# Patient Record
Sex: Male | Born: 1975 | Hispanic: Yes | Marital: Single
Health system: Southern US, Community
[De-identification: ages and names within clinical notes are randomized; demographics above are authoritative.]

## PROBLEM LIST (undated history)

## (undated) DIAGNOSIS — I10 Essential (primary) hypertension: Secondary | ICD-10-CM

## (undated) DIAGNOSIS — K469 Unspecified abdominal hernia without obstruction or gangrene: Secondary | ICD-10-CM

## (undated) DIAGNOSIS — J45909 Unspecified asthma, uncomplicated: Secondary | ICD-10-CM

## (undated) DIAGNOSIS — K219 Gastro-esophageal reflux disease without esophagitis: Secondary | ICD-10-CM

## (undated) HISTORY — DX: Unspecified abdominal hernia without obstruction or gangrene: K46.9

## (undated) HISTORY — DX: Gastro-esophageal reflux disease without esophagitis: K21.9

---

## 2013-04-01 ENCOUNTER — Emergency Department (HOSPITAL_COMMUNITY): Payer: No Typology Code available for payment source

## 2013-04-01 ENCOUNTER — Emergency Department (HOSPITAL_COMMUNITY)
Admission: EM | Admit: 2013-04-01 | Discharge: 2013-04-01 | Disposition: A | Discharge status: Home / Self Care | Payer: No Typology Code available for payment source | Attending: Emergency Medicine | Admitting: Emergency Medicine

## 2013-04-01 ENCOUNTER — Encounter (HOSPITAL_COMMUNITY): Payer: Self-pay | Admitting: Radiology

## 2013-04-01 DIAGNOSIS — S8000XA Contusion of unspecified knee, initial encounter: Secondary | ICD-10-CM | POA: Insufficient documentation

## 2013-04-01 DIAGNOSIS — S8002XA Contusion of left knee, initial encounter: Secondary | ICD-10-CM

## 2013-04-01 DIAGNOSIS — Y92410 Unspecified street and highway as the place of occurrence of the external cause: Secondary | ICD-10-CM | POA: Insufficient documentation

## 2013-04-01 DIAGNOSIS — S301XXA Contusion of abdominal wall, initial encounter: Secondary | ICD-10-CM

## 2013-04-01 DIAGNOSIS — S139XXA Sprain of joints and ligaments of unspecified parts of neck, initial encounter: Secondary | ICD-10-CM | POA: Insufficient documentation

## 2013-04-01 DIAGNOSIS — Y9389 Activity, other specified: Secondary | ICD-10-CM | POA: Insufficient documentation

## 2013-04-01 DIAGNOSIS — S161XXA Strain of muscle, fascia and tendon at neck level, initial encounter: Secondary | ICD-10-CM

## 2013-04-01 MED ORDER — OXYCODONE-ACETAMINOPHEN 5-325 MG PO TABS
2.0000 | ORAL_TABLET | Freq: Once | ORAL | Status: AC
  Administered 2013-04-01: 2 via ORAL
  Filled 2013-04-01: qty 2

## 2013-04-01 MED ORDER — TRAMADOL HCL 50 MG PO TABS: 50.0000 mg | ORAL_TABLET | Freq: Four times a day (QID) | ORAL | Status: AC | PRN

## 2013-04-01 MED ORDER — IOHEXOL 300 MG/ML  SOLN
100.0000 mL | Freq: Once | INTRAMUSCULAR | Status: AC | PRN
  Administered 2013-04-01: 100 mL via INTRAVENOUS

## 2013-04-01 MED ORDER — NAPROXEN 500 MG PO TABS: 500.0000 mg | ORAL_TABLET | Freq: Two times a day (BID) | ORAL | Status: AC
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
## 2013-04-01 NOTE — Discharge Instructions (Signed)
xrays are normal Wear the knee brace for one week Naprosyn twice daily Ultram for severe pain    Acetaminophen; Oxycodone tablets or PERCOCET  This is a pain reliever. It is used to treat mild to moderate pain.  This medicine may be used for other purposes; ask your health care provider or pharmacist if you have questions.  What should I tell my health care provider before I take this medicine?  They need to know if you have any of these conditions:  -brain tumor  -Crohn's disease, inflammatory bowel disease, or ulcerative colitis  -drink more than 3 alcohol containing drinks per day  -drug abuse or addiction  -head injury  -heart or circulation problems  -kidney disease or problems going to the bathroom  -liver disease  -lung disease, asthma, or breathing problems  -an unusual or allergic reaction to acetaminophen, oxycodone, other opioid analgesics, other medicines, foods, dyes, or preservatives  -pregnant or trying to get pregnant  -breast-feeding  How should I use this medicine?  Take this medicine by mouth with a full glass of water. Follow the directions on the prescription label. Take your medicine at regular intervals. Do not take your medicine more often than directed.  Talk to your pediatrician regarding the use of this medicine in children. Special care may be needed.  Patients over 36 years old may have a stronger reaction and need a smaller dose.  Overdosage: If you think you have taken too much of this medicine contact a poison control center or emergency room at once.  NOTE: This medicine is only for you. Do not share this medicine with others.   What may interact with this medicine?  -alcohol or medicines that contain alcohol  -antihistamines  -barbiturates like amobarbital, butalbital, butabarbital, methohexital, pentobarbital, phenobarbital, thiopental, and secobarbital  -benztropine  -drugs for bladder problems like solifenacin, trospium, oxybutynin,  tolterodine, hyoscyamine, and methscopolamine  -drugs for breathing problems like ipratropium and tiotropium  -drugs for certain stomach or intestine problems like propantheline, homatropine methylbromide, glycopyrrolate, atropine, belladonna, and dicyclomine  -general anesthetics like etomidate, ketamine, nitrous oxide, propofol, desflurane, enflurane, halothane, isoflurane, and sevoflurane  -medicines for depression, anxiety, or psychotic disturbances  -medicines for pain like codeine, morphine, pentazocine, buprenorphine, butorphanol, nalbuphine, tramadol, and propoxyphene  -medicines for sleep  -muscle relaxants  -naltrexone  -phenothiazines like perphenazine, thioridazine, chlorpromazine, mesoridazine, fluphenazine, prochlorperazine, promazine, and trifluoperazine  -scopolamine  -trihexyphenidyl  This list may not describe all possible interactions. Give your health care provider a list of all the medicines, herbs, non-prescription drugs, or dietary supplements you use. Also tell them if you smoke, drink alcohol, or use illegal drugs. Some items may interact with your medicine.  What should I watch for while using this medicine?  Tell your doctor or health care professional if your pain does not go away, if it gets worse, or if you have new or a different type of pain Do not suddenly stop taking your medicine because you may develop a severe reaction. Your body becomes used to the medicine. This does NOT mean you are addicted. Addiction is a behavior related to getting and using a drug for a nonmedical reason.  You may get drowsy or dizzy. Do not drive, use machinery, or do anything that needs mental alertness until you know how this medicine affects you. Do not stand or sit up quickly, especially if you are an older patient. This reduces the risk of dizzy or fainting spells. Alcohol may interfere with the effect of  this medicine. Avoid alcoholic drinks.  The medicine will cause constipation.  Try to have a bowel movement at least every 2 to 3 days. If you do not have a bowel movement for 3 days, call your doctor or health care professional.  Do not take Tylenol (acetaminophen) or medicines that have acetaminophen with this medicine. Too much acetaminophen can be very dangerous. Many nonprescription medicines contain acetaminophen. Always read the labels carefully to avoid taking more acetaminophen.   What side effects may I notice from receiving this medicine?  Side effects that you should report to your doctor or health care professional as soon as possible:  -allergic reactions like skin rash, itching or hives, swelling of the face, lips, or tongue  -breathing difficulties, wheezing  -confusion  -light headedness or fainting spells  -severe stomach pain  -yellowing of the skin or the whites of the eyes  Side effects that usually do not require medical attention (report to your doctor or health care professional if they continue or are bothersome):  -dizziness  -drowsiness  -nausea  -vomiting  This list may not describe all possible side effects. Call your doctor for medical advice about side effects. You may report side effects to FDA at 1-800-FDA-1088.  Where should I keep my medicine?  Keep out of the reach of children. This medicine can be abused. Keep your medicine in a safe place to protect it from theft. Do not share this medicine with anyone. Selling or giving away this medicine is dangerous and against the law.   NOTE: This sheet is a summary. It may not cover all possible information. If you have questions about this medicine, talk to your doctor, pharmacist, or health care provider.      Emergency Department Resource Guide 1) Find a Doctor and Pay Out of Pocket Although you won't have to find out who is covered by your insurance plan, it is a good idea to ask around and get recommendations. You will then need to call the office and see if the doctor you have chosen  will accept you as a new patient and what types of options they offer for patients who are self-pay. Some doctors offer discounts or will set up payment plans for their patients who do not have insurance, but you will need to ask so you aren't surprised when you get to your appointment.  2) Contact Your Local Health Department Not all health departments have doctors that can see patients for sick visits, but many do, so it is worth a call to see if yours does. If you don't know where your local health department is, you can check in your phone book. The CDC also has a tool to help you locate your state's health department, and many state websites also have listings of all of their local health departments.  3) Find a Walk-in Clinic If your illness is not likely to be very severe or complicated, you may want to try a walk in clinic. These are popping up all over the country in pharmacies, drugstores, and shopping centers. They're usually staffed by nurse practitioners or physician assistants that have been trained to treat common illnesses and complaints. They're usually fairly quick and inexpensive. However, if you have serious medical issues or chronic medical problems, these are probably not your best option.  No Primary Care Doctor: - Call Health Connect at  [phone] - they can help you locate a primary care doctor that  accepts your insurance, provides certain services,  etc. - Physician Referral Service- [phone]  Chronic Pain Problems: Organization         Address  Phone   Notes  [facility]  [phone] Patients need to be referred by their primary care doctor.   Medication Assistance: Organization         Address  Phone   Notes  [facility] Medication [facility] [address] [place] [place], [place] [address] [phone] --Must be a resident of [facility] -- Must have NO insurance coverage whatsoever (no Medicaid/ Medicare, etc.) --  The pt. MUST have a primary care doctor that directs their care regularly and follows them in the community   [facility]  [phone]   [employer]  [phone]    Agencies that provide inexpensive medical care: Organization         Address  Phone   Notes  [facility]  [phone]   [facility]    [phone]   [facility] [address] [place], [place] [address] [phone]   [facility] [address] [place], [place] [phone]   Planned Parenthood    [phone]   [facility]    [phone]   [facility]  [address], [place] Phone:  [phone], Fax:  [phone] Hours of Operation:  9 am - 6 pm, M-F.  Also accepts Medicaid/Medicare and self-pay.  [facility]  [address], [place] Phone: [phone], Fax: [phone]. Hours of Operation:  8:30 am - 5:30 pm, M-F.  Also accepts Medicaid and self-pay.  [facility] [place] [address], [place] Phone: [phone]   [facility] [address] Natasha Bence [place], [place] [phone], Ext. [phone] Mondays & Thursdays: 7-9 AM.  First 15 patients are seen on a first come, first serve basis.    Medicaid-accepting [facility] Providers:  Organization         Address  Phone   Notes  [facility] [address], [place] [phone] Also accepts self-pay patients.  [facility] [address] Laurell Josephs [place], [place]  [phone]   [facility] [address], [place] [phone]   [facility] [address] [place] [phone]   Renaye Rakers [address], [place]   [phone] Only accepts [place] Access [place] patients after they have their name applied to their card.   Self-Pay (no insurance)  in [facility]:  Organization         Address  Phone   Notes  Sickle Cell Patients, [facility] [address] [place], [place] [phone]   [facility] [address] [place], [place] [phone]   [facility] [place]  [address],  [phone]   [facility]/Dr. Osei-Bonsu  [address], [place] or [address], [place] [phone] Phone number for both [place] and [place] locations is the same.  [facility] [address], [place] [phone]   [facility] [address] [place] or [address] [phone]   [facility] [address] [place], [place] (  [phone], phone; [phone], fax Sees patients 1st and 3rd Saturday of every month.  Must not qualify for public or private insurance (i.e. Medicaid, Medicare, [place] Health Choice, Veterans' Benefits)  Household income should be no more than 200% of the poverty level The clinic cannot treat you if you are pregnant or think you are pregnant  Sexually transmitted diseases are not treated at the clinic.    Dental Care: Organization         Address  Phone  Notes  [facility] Department of [facility] [place] [phone] Accepts children up to age 62 who are enrolled in [place] or [place]; pregnant women with a Medicaid card; and children who have applied for Medicaid or [place] Health Choice, but were declined, whose parents can pay a reduced fee at time of service.  [facility] Department of [facility]  [address], [place] [phone] Accepts children up to age 41 who are enrolled in [place] or [place]; pregnant women with a Medicaid card; and children who have applied for Medicaid or [place] Health Choice, but were declined, whose  parents can pay a reduced fee at time of service.  [facility] Access PRO[facility]  [place] [phone] Patients are seen by appointment only. Walk-ins are not accepted. [place] will see patients 71 years of age and older. Monday - Tuesday (8am-5pm) Most Wednesdays (8:30-5pm) $30 per visit, cash only  [facility] Adult Dental Access PROGRAM  [address], [facility] [phone] Patients are seen by appointment only. Walk-ins are not accepted. [place] will see patients 81 years of age and older. One Wednesday Evening (Monthly: Volunteer Based).  $30 per visit, cash only  [place]  [phone] for adults; Children under age 8, call Graduate Pediatric Dentistry at [phone]. Children aged 62-14, please call [phone] to request a pediatric application.  Dental services are provided in all areas of dental care including fillings, crowns and bridges, complete and partial dentures, implants, gum treatment, root canals, and extractions. Preventive care is also provided. Treatment is provided to both adults and children. Patients are selected via a lottery and there is often a waiting list.   [facility] [address], [place]  [phone] [email or web address]   [facility] [address] [place], [place] [phone], Ext. [phone] Second and Fourth Thursday of each month, opens at 6:30 AM; Clinic ends at 9 AM.  Patients are seen on a first-come first-served basis, and a limited number are seen during each clinic.   [facility]  [address] Hillard Danker [place], [place] [phone]   Eligibility Requirements You must have lived in [place], [place], or [place] counties for at least the last three months.   You cannot be eligible for state or federal sponsored [employer], including [employer], [place], or [employer].   You generally cannot be eligible for  healthcare insurance through your employer.    How to apply: Eligibility screenings are held every Tuesday and Wednesday afternoon from 1:00 pm until 4:00 pm. You do not need an appointment for the interview!  [facility] [address] [place], [place]   [place]  [place]  [place]  [phone]    Behavioral Health  Resources in the Community: Intensive Outpatient Programs Organization         Address  Phone  Notes  [facility] [place]. [address], [place], [place] [phone]   [facility] Outpatient [address] [place], [place]   [facility] [address] [place], [place]   [place] [address]  [place], [place] or [phone]   Substance Abuse Resources Organization         Address  Phone  Notes  [facility] Drug Services  [phone]   [place]  [phone]   [facility] [place]   Chinita Pester  [phone]   Residential & Outpatient Substance Abuse Program  [phone]   Psychological Services Organization         Address  Phone  Notes  [facility] [place]  [place]  [phone]   [place] [address], [place] or [phone]    Mobile Crisis Teams Organization         Address  Phone  Notes  [facility], Mobile Crisis Care Unit  [phone]   [facility]  [address] [place], [place]   [place] Levels [address] [place] [phone]    Self-Help/Support Groups Organization         Address  Phone             Notes  [place]. of [place] - variety of support groups  [place] Call for more information  Narcotics  Anonymous (NA), [facility] [address], [employer] [place]  2 meetings at this location   Special educational needs teacher         Address  Phone  Notes  [facility] [place],    [place]  [phone]   [facility]  [address] [place] [address], [place], [place]   [place], [place] [phone] Admissions: 8am-3pm M-F  Incentives Substance [place] [address],    [place], [place] [phone]   [facility] [address] [place], [place], [place]   [facility] [address],  [place], [place]   [facility] - Intensive Outpatient [address] Kristeen Mans [address], [place], [place]   [facility] ([place].,  [place], [place] [phone] or [phone]   [facility] ([facility]) [address] [place], [place] Accepts Medicaid  [place] [address]  [place] [phone] Substance Abuse/Addiction Treatment   [facility] Organization         Address  Phone  Notes  [facility]  [phone]   Domenic Schwab, PhD [address] Arlis Porta [place], [place]   [phone] or [phone]   Bethune Newell [place], [place] [phone]   [place] [address] [place], [place] [phone] Insurance/Medicaid/sponsorship through [employer] and Families [address] [place]                                    [place], [place] [phone] [place] [address]   [place], [place] [phone]    Dr. Lolly Mustache  [phone]   [facility] [place]  [employer] [facility] Dept. 1) [address], [place] 2) [address], [place] 3)  [address], [place] [phone]  [phone]   [facility] Child Abuse Hotline [phone] or [phone] (After Hours)
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
## 2017-02-22 ENCOUNTER — Ambulatory Visit (INDEPENDENT_AMBULATORY_CARE_PROVIDER_SITE_OTHER): Payer: Self-pay | Admitting: Physician Assistant

## 2017-02-22 ENCOUNTER — Encounter: Payer: Self-pay | Admitting: Physician Assistant

## 2017-02-22 VITALS — BP 134/86 | HR 92 | Temp 97.7°F | Resp 16 | Ht 66.0 in | Wt 234.0 lb

## 2017-02-22 DIAGNOSIS — L03011 Cellulitis of right finger: Secondary | ICD-10-CM

## 2017-02-22 DIAGNOSIS — M7989 Other specified soft tissue disorders: Secondary | ICD-10-CM

## 2017-02-22 DIAGNOSIS — Z23 Encounter for immunization: Secondary | ICD-10-CM

## 2017-02-22 MED ORDER — DOXYCYCLINE HYCLATE 100 MG PO CAPS: 100.0000 mg | ORAL_CAPSULE | Freq: Two times a day (BID) | ORAL | 0 refills | Status: AC

## 2017-02-22 NOTE — Patient Instructions (Signed)
You can take ibuprofen or tylenol for pain or fever. Please soak the finger three times per day.  This can be clean water, or soapy water.  Dry off and then wrap. Do not submerge your finger in dirty waters.  Do not work with any debris or dirt at this time.  You can not get anything into the wound.   Paroniquia (Paronychia) La paroniquia es una infeccin de la piel que se produce cerca de las uas de las manos o los pies. Puede causar dolor e hinchazn alrededor de la ua. Por lo general, no es grave y desaparece con un tratamiento. CUIDADOS EN EL HOGAR  Sumerja los dedos de las manos o los pies en agua tibia como se lo haya indicado el mdico. Pueden indicarle que haga esto durante 20minutos, de 2a 3veces al da.  Cuando el rea no est en remojo, mantngala seca.  Tome los medicamentos solamente como se lo haya indicado el mdico.  Si le indicaron que tome antibiticos, debe terminarlos, incluso si comienza a Actorsentirse mejor.  Mantenga la limpieza del rea afectada.  No trate de drenar un bulto lleno de lquido por su cuenta.  Use guantes de goma al introducir las UGI Corporationmanos en el agua.  Use guantes en el caso de contacto con agentes de limpieza o sustancias qumicas.  Siga las indicaciones de su mdico respecto de lo siguiente: ? Cuidado de las heridas. ? Cambiar y Scientist, research (physical sciences)retirar la venda (vendaje). SOLICITE AYUDA SI:  Los sntomas empeoran o no mejoran.  Tiene fiebre o siente escalofros.  Tiene enrojecimiento que se extiende desde el rea afectada.  Tiene ms lquido, sangre o pus que salen del rea afectada.  Tiene el dedo o el nudillo hinchado, o le resulta difcil moverlo. Esta informacin no tiene Theme park managercomo fin reemplazar el consejo del mdico. Asegrese de hacerle al mdico cualquier pregunta que tenga. Document Released: 02/11/2010 Document Revised: 05/26/2014 Document Reviewed: 12/17/2013 Elsevier Interactive Patient Education  [employer]2018 Elsevier Inc.

## 2017-02-22 NOTE — Progress Notes (Signed)
PRIMARY CARE AT [facility] [address], [place] [address] [phone]  Date:  02/22/2017   Name:  Robert English   DOB:  01/30/1975   MRN:  [id number]  PCP:  Patient, No Pcp Per    History of Present Illness:  Robert English is a 42 y.o. male patient who presents to PCP with  Chief Complaint  Patient presents with  . Hand Pain    right hand, index finger has a knot on it that has puss coiming out, x 2 days     Right index finger he reports with 3 days of bump and swelling.   He tried to open this with a pin.   He had discharge of the right index finger when he stuck it. Right hand dominant No red streaking up the arm No fever.  He is able to bend the finger but has swelling.    There are no active problems to display for this patient.   History reviewed. No pertinent past medical history.  History reviewed. No pertinent surgical history.  Social History   Tobacco Use  . Smoking status: Not on file  Substance Use Topics  . Alcohol use: Not on file  . Drug use: Not on file    History reviewed. No pertinent family history.  No Known Allergies  Medication list has been reviewed and updated.  Current Outpatient Medications on File Prior to Visit  Medication Sig Dispense Refill  . acetaminophen (TYLENOL) 500 MG tablet Take 500 mg by mouth every 6 (six) hours as needed for mild pain, moderate pain, fever or headache.    . naproxen (NAPROSYN) 500 MG tablet Take 1 tablet (500 mg total) by mouth 2 (two) times daily with a meal. (Patient not taking: Reported on 02/22/2017) 30 tablet 0  . traMADol (ULTRAM) 50 MG tablet Take 1 tablet (50 mg total) by mouth every 6 (six) hours as needed. (Patient not taking: Reported on 02/22/2017) 15 tablet 0   No current facility-administered medications on file prior to visit.     ROS ROS otherwise unremarkable unless listed above.  Physical Examination: BP 134/86   Pulse 92   Temp 97.7 F (36.5 C) (Oral)   Resp  16   Ht 5\' 6"  (1.676 m)   Wt 234 lb (106.1 kg)   SpO2 96%   BMI 37.77 kg/m  Ideal Body Weight: Weight in (lb) to have BMI = 25: 154.6  Physical Exam  Constitutional: He is oriented to person, place, and time. He appears well-developed and well-nourished. No distress.  HENT:  Head: Normocephalic and atraumatic.  Eyes: Conjunctivae and EOM are normal. Pupils are equal, round, and reactive to light.  Cardiovascular: Normal rate.  Pulmonary/Chest: Effort normal. No respiratory distress.  Neurological: He is alert and oriented to person, place, and time.  Skin: Skin is warm and dry. Capillary refill takes less than 2 seconds. He is not diaphoretic.  Erythema and swelling of the right index finger at the dip.  There is granulation-type tissue at the lateral area of the finger at the cuticle line.  No purulent drainage with palpation.. No finding of pocketed paronychia  No red streaking up the arm or signs of lymphangitis.  Psychiatric: He has a normal mood and affect. His behavior is normal.     Assessment and Plan: Robert English is a 42 y.o. male who is here today for cc of  Chief Complaint  Patient presents with  . Hand Pain  right hand, index finger has a knot on it that has puss coiming out, x 2 days   Appears the "I and D" at home excised purulent drainage.  will obtain wound culture.  Started on keflex.   Tetanus updated today. Advised warm soaks.   Rtc in 3 days.  Paronychia of finger of right hand - Plan: WOUND CULTURE, doxycycline (VIBRAMYCIN) 100 MG capsule, Td vaccine greater than or equal to 7yo preservative free IM  Finger swelling  Need for tetanus booster - Plan: Td vaccine greater than or equal to 7yo preservative free IM  Trena PlattStephanie Ayriana Wix, PA-C [facility]

## 2017-02-24 ENCOUNTER — Ambulatory Visit: Payer: Self-pay | Admitting: Physician Assistant

## 2017-02-24 LAB — WOUND CULTURE: Organism ID, Bacteria: NONE SEEN

## 2017-02-26 ENCOUNTER — Ambulatory Visit: Payer: Self-pay | Admitting: Physician Assistant

## 2017-03-04 ENCOUNTER — Encounter: Payer: Self-pay | Admitting: Physician Assistant

## 2017-04-23 ENCOUNTER — Encounter: Payer: Self-pay | Admitting: Physician Assistant

## 2022-06-06 ENCOUNTER — Ambulatory Visit
Admission: EM | Admit: 2022-06-06 | Discharge: 2022-06-06 | Disposition: A | Discharge status: Home / Self Care | Payer: 59 | Attending: Internal Medicine | Admitting: Internal Medicine

## 2022-06-06 ENCOUNTER — Ambulatory Visit: Payer: Self-pay | Admitting: Family Medicine

## 2022-06-06 DIAGNOSIS — K429 Umbilical hernia without obstruction or gangrene: Secondary | ICD-10-CM | POA: Diagnosis not present

## 2022-06-06 NOTE — ED Triage Notes (Signed)
Patient with c/o umbilical pain. Unsure how long it has been going on.

## 2022-06-06 NOTE — ED Provider Notes (Signed)
[facility]    CSN: [id number] Arrival date & time: 06/06/22  1101      History   Chief Complaint Chief Complaint  Patient presents with   Abdominal Pain    HPI Robert English is a 47 y.o. male.   Patient presents with umbilical hernia that has been present for about 1.5 years.  Patient reports that he has not had any nausea, vomiting, constipation, diarrhea.  He reports that he is able to have normal bowel movements and is urinating appropriately.  Denies any associated fever.  Reports he does have a little bit of mild abdominal discomfort that started recently but patient was not able to tell me the timeframe.  Reports that is very mild and mainly occurs when he is lifting things.   Abdominal Pain   History reviewed. No pertinent past medical history.  There are no problems to display for this patient.   History reviewed. No pertinent surgical history.     Home Medications    Prior to Admission medications   Medication Sig Start Date End Date Taking? Authorizing Provider  acetaminophen (TYLENOL) 500 MG tablet Take 500 mg by mouth every 6 (six) hours as needed for mild pain, moderate pain, fever or headache.    [provider]  naproxen (NAPROSYN) 500 MG tablet Take 1 tablet (500 mg total) by mouth 2 (two) times daily with a meal. Patient not taking: Reported on 02/22/2017 04/01/13   Eber Hong, MD  traMADol (ULTRAM) 50 MG tablet Take 1 tablet (50 mg total) by mouth every 6 (six) hours as needed. Patient not taking: Reported on 02/22/2017 04/01/13   Eber Hong, MD    Family History No family history on file.  Social History Social History   Tobacco Use   Smoking status: Unknown  Vaping Use   Vaping Use: Unknown  Substance Use Topics   Alcohol use: Not Currently     Allergies   Patient has no known allergies.   Review of Systems Review of Systems Per HPI  Physical Exam Triage Vital Signs ED Triage Vitals [06/06/22  1126]  Enc Vitals Group     BP (!) 149/92     Pulse Rate 74     Resp 18     Temp 97.8 F (36.6 C)     Temp Source Oral     SpO2 97 %     Weight      Height      Head Circumference      Peak Flow      Pain Score 5     Pain Loc      Pain Edu?      Excl. in GC?    No data found.  Updated Vital Signs BP (!) 149/92 (BP Location: Left Arm)   Pulse 74   Temp 97.8 F (36.6 C) (Oral)   Resp 18   SpO2 97%   Visual Acuity Right Eye Distance:   Left Eye Distance:   Bilateral Distance:    Right Eye Near:   Left Eye Near:    Bilateral Near:     Physical Exam Constitutional:      General: He is not in acute distress.    Appearance: Normal appearance. He is not toxic-appearing or diaphoretic.  HENT:     Head: Normocephalic and atraumatic.  Eyes:     Extraocular Movements: Extraocular movements intact.     Conjunctiva/sclera: Conjunctivae normal.  Pulmonary:     Effort: Pulmonary effort  is normal.  Abdominal:     General: Bowel sounds are normal. There is no distension.     Palpations: Abdomen is soft.     Tenderness: There is no abdominal tenderness.     Hernia: A hernia is present. Hernia is present in the umbilical area.     Comments: Patient has umbilical hernia present to the right lower portion of the umbilicus.  Neurological:     General: No focal deficit present.     Mental Status: He is alert and oriented to person, place, and time. Mental status is at baseline.  Psychiatric:        Mood and Affect: Mood normal.        Behavior: Behavior normal.        Thought Content: Thought content normal.        Judgment: Judgment normal.      UC Treatments / Results  Labs (all labs ordered are listed, but only abnormal results are displayed) Labs Reviewed - No data to display  EKG   Radiology No results found.  Procedures Procedures (including critical care time)  Medications Ordered in UC Medications - No data to display  Initial Impression / Assessment  and Plan / UC Course  I have reviewed the triage vital signs and the nursing notes.  Pertinent labs & imaging results that were available during my care of the patient were reviewed by me and considered in my medical decision making (see chart for details).     Patient presenting with umbilical hernia that has been present for 1.5 years.  There is no signs of complication such as strangulation or gangrene on physical exam.  Therefore, do not think that emergent evaluation is necessary.  Will place ambulatory referral to general surgery for further evaluation.  Advised patient that if they do not call him in the next 48 to 72 hours, he is to call them himself at provided contact information.  Encouraged strict ER precautions as well.  Patient verbalized understanding and was agreeable with plan.  Interpreter used today. Final Clinical Impressions(s) / UC Diagnoses   Final diagnoses:  Umbilical hernia without obstruction and without gangrene     Discharge Instructions      I have placed a referral to general surgery to have your hernia evaluated and for possible surgery.  If they do not call you within the next 48 to 72 hours, please call them yourself at provided contact information.  If you develop nausea, vomiting, constipation, or more significant abdominal pain please go immediately to the emergency department.    ED Prescriptions   None    PDMP not reviewed this encounter.   Gustavus Bryant, [place] 06/06/22 1219

## 2022-06-06 NOTE — Discharge Instructions (Addendum)
I have placed a referral to general surgery to have your hernia evaluated and for possible surgery.  If they do not call you within the next 48 to 72 hours, please call them yourself at provided contact information.  If you develop nausea, vomiting, constipation, or more significant abdominal pain please go immediately to the emergency department.

## 2022-06-12 NOTE — Progress Notes (Unsigned)
  Subjective:    Robert English - 47 y.o. male MRN [id number]  Date of birth: 04-25-1975  HPI  Robert English is to establish care.  Current issues and/or concerns: 06/06/2022 [facility] - umbilical hernia, referred to [facility]  ROS per HPI     Health Maintenance:  Health Maintenance Due  Topic Date Due   COVID-19 Vaccine (1) Never done   HIV Screening  Never done   Hepatitis C Screening  Never done   COLONOSCOPY (Pts 45-65yrs Insurance coverage will need to be confirmed)  Never done     Past Medical History: There are no problems to display for this patient.     Social History   reports that he does not currently use alcohol.   Family History  family history is not on file.   Medications: reviewed and updated   Objective:   Physical Exam There were no vitals taken for this visit. Physical Exam      Assessment & Plan:         Patient was given clear instructions to go to Emergency Department or return to medical center if symptoms don't improve, worsen, or new problems develop.The patient verbalized understanding.  I discussed the assessment and treatment plan with the patient. The patient was provided an opportunity to ask questions and all were answered. The patient agreed with the plan and demonstrated an understanding of the instructions.   The patient was advised to call back or seek an in-person evaluation if the symptoms worsen or if the condition fails to improve as anticipated.    Ricky Stabs, NP 06/12/2022, 8:49 AM Primary Care at [facility]

## 2022-06-14 ENCOUNTER — Encounter: Payer: Self-pay | Admitting: Family

## 2022-06-14 DIAGNOSIS — Z603 Acculturation difficulty: Secondary | ICD-10-CM

## 2022-06-14 DIAGNOSIS — Z7689 Persons encountering health services in other specified circumstances: Secondary | ICD-10-CM

## 2022-08-16 ENCOUNTER — Telehealth: Payer: Self-pay

## 2022-08-17 ENCOUNTER — Ambulatory Visit (INDEPENDENT_AMBULATORY_CARE_PROVIDER_SITE_OTHER): Payer: 59 | Admitting: Family Medicine

## 2022-08-17 ENCOUNTER — Encounter: Payer: Self-pay | Admitting: Family Medicine

## 2022-08-17 VITALS — BP 117/83 | HR 74 | Resp 16 | Ht 66.0 in | Wt 223.0 lb

## 2022-08-17 DIAGNOSIS — Z7689 Persons encountering health services in other specified circumstances: Secondary | ICD-10-CM | POA: Diagnosis not present

## 2022-08-17 DIAGNOSIS — K429 Umbilical hernia without obstruction or gangrene: Secondary | ICD-10-CM | POA: Diagnosis not present

## 2022-08-17 DIAGNOSIS — R4589 Other symptoms and signs involving emotional state: Secondary | ICD-10-CM | POA: Diagnosis not present

## 2022-08-17 NOTE — Progress Notes (Signed)
New Patient Office Visit  Subjective    Patient ID: Robert English, male    DOB: [birth year]  Age: 47 y.o. MRN: [id number]  CC:  Chief Complaint  Patient presents with   Establish Care    Abdominal pain due to hernia pian is located by the belly button onset for about a year    HPI Robert English presents to establish care with this practice. He is new to me. There is an interpreter in place during this visit.   Abdominal pain due to hernia: present for one year. Getting worse wants "an operation" no fever or constipation. Reports having bowel movement every day and it is normal.  He was carrying heavy paint while walking on the stairs when the "bulge" occurred.  Wearing a binder for support. No nausea or vomiting. Eating normal. Bowels are normal.   Depression and anxiety: denies thoughts of self-harm, will continue to monitor. Reports the hernia is causing him more anxiety.      Outpatient Encounter Medications as of 08/17/2022  Medication Sig   acetaminophen (TYLENOL) 500 MG tablet Take 500 mg by mouth every 6 (six) hours as needed for mild pain, moderate pain, fever or headache.   naproxen (NAPROSYN) 500 MG tablet Take 1 tablet (500 mg total) by mouth 2 (two) times daily with a meal. (Patient not taking: Reported on 02/22/2017)   traMADol (ULTRAM) 50 MG tablet Take 1 tablet (50 mg total) by mouth every 6 (six) hours as needed. (Patient not taking: Reported on 02/22/2017)   No facility-administered encounter medications on file as of 08/17/2022.    Past Medical History:  Diagnosis Date   GERD (gastroesophageal reflux disease)    Hernia of abdominal cavity     History reviewed. No pertinent surgical history.  Family History  Problem Relation Age of Onset   Diabetes Mother    Diabetes Maternal Grandmother     Social History   Socioeconomic History   Marital status: Single    Spouse name: Not on file   Number of children: Not on file   Years of  education: Not on file   Highest education level: Not on file  Occupational History   Not on file  Tobacco Use   Smoking status: Unknown   Smokeless tobacco: Not on file  Vaping Use   Vaping status: Unknown  Substance and Sexual Activity   Alcohol use: Not Currently   Drug use: Not on file   Sexual activity: Not on file  Other Topics Concern   Not on file  Social History Narrative   Not on file   Social Determinants of Health   Financial Resource Strain: Not on file  Food Insecurity: Not on file  Transportation Needs: Not on file  Physical Activity: Not on file  Stress: Not on file  Social Connections: Not on file  Intimate Partner Violence: Not on file    Review of Systems  Constitutional:  Negative for fever.  Gastrointestinal:  Positive for abdominal pain (only when lifting something heavy). Negative for constipation, diarrhea, nausea and vomiting.  Psychiatric/Behavioral:  Positive for depression. Negative for suicidal ideas. The patient is nervous/anxious.         Objective    BP 117/83   Pulse 74   Resp 16   Ht 5\' 6"  (1.676 m)   Wt 223 lb (101.2 kg)   SpO2 99%   BMI 35.99 kg/m   Physical Exam Vitals and nursing note reviewed.  Constitutional:  General: He is not in acute distress.    Appearance: Normal appearance. He is obese.  Cardiovascular:     Rate and Rhythm: Regular rhythm.     Heart sounds: Normal heart sounds.  Pulmonary:     Effort: Pulmonary effort is normal.     Breath sounds: Normal breath sounds.  Abdominal:     Palpations: Abdomen is soft.     Tenderness: There is generalized abdominal tenderness (generalized.).     Hernia: A hernia is present. Hernia is present in the umbilical area.     Comments: Bulge in umbilical area. No erythema or warmth, Normal bowel function. No nausea or vomiting.   Skin:    General: Skin is warm and dry.     Capillary Refill: Capillary refill takes less than 2 seconds.  Neurological:     General:  No focal deficit present.     Mental Status: He is alert. Mental status is at baseline.  Psychiatric:        Mood and Affect: Mood normal.        Behavior: Behavior normal.        Thought Content: Thought content normal.        Judgment: Judgment normal.       Assessment & Plan:   Problem List Items Addressed This Visit     Establishing care with new doctor, encounter for - Primary   Umbilical hernia without obstruction and without gangrene   Relevant Orders   CBC with Differential/Platelet   Comprehensive metabolic panel   US Abdomen Limited   Anxiety about health  Bulging area at umbilicus. Labs and limited ultrasound ordered today for further evaluation. Reports having anxiety concerning the hernia.  Will likely need referral to general surgeon for definitive treatment.  Reports anxiety concerning the hernia. Will continue to monitor. Suspect this will improve once treatment plan is established.  Agrees with plan of care discussed.  Questions answered.   Return for based on lab and ultrasound results.   Novella Olive, FNP

## 2022-08-18 ENCOUNTER — Ambulatory Visit (INDEPENDENT_AMBULATORY_CARE_PROVIDER_SITE_OTHER): Payer: 59

## 2022-08-18 DIAGNOSIS — K429 Umbilical hernia without obstruction or gangrene: Secondary | ICD-10-CM | POA: Diagnosis not present

## 2022-08-18 LAB — COMPREHENSIVE METABOLIC PANEL: CO2: 25 mmol/L (ref 20–29)

## 2022-08-18 LAB — CBC WITH DIFFERENTIAL/PLATELET: Immature Granulocytes: 1 %

## 2022-08-22 ENCOUNTER — Telehealth: Payer: Self-pay

## 2022-08-22 NOTE — Telephone Encounter (Signed)
Called patient using Interpreter services, Interpreter ID # [id number] LVM for patient to return call to discuss lab results

## 2022-08-22 NOTE — Telephone Encounter (Signed)
-----   Message from Novella Olive sent at 08/18/2022  8:07 AM EDT ----- Patient is not on My Chart.  The CBC does not show signs of infection or anemia. It does show slightly decreased platelets. CMP shows normal electrolytes, normal kidney and liver function. Thanks, Clydie Braun

## 2022-09-12 ENCOUNTER — Telehealth: Payer: Self-pay

## 2022-09-12 NOTE — Telephone Encounter (Addendum)
Reached out to patient  with interpreter, patient could not answer phone due to patient being at work and can not have his phone on him while working. Provider is aware      -- Message from Novella Olive sent at 08/18/2022  8:07 AM EDT ----- Patient is not on My Chart.  The CBC does not show signs of infection or anemia. It does show slightly decreased platelets. CMP shows normal electrolytes, normal kidney and liver function. Thanks, Clydie Braun

## 2023-04-23 ENCOUNTER — Emergency Department (HOSPITAL_COMMUNITY): Payer: Self-pay

## 2023-04-23 ENCOUNTER — Other Ambulatory Visit: Payer: Self-pay

## 2023-04-23 ENCOUNTER — Emergency Department (HOSPITAL_COMMUNITY)
Admission: EM | Admit: 2023-04-23 | Discharge: 2023-04-23 | Disposition: A | Discharge status: Home / Self Care | Payer: Self-pay | Attending: Emergency Medicine | Admitting: Emergency Medicine

## 2023-04-23 ENCOUNTER — Encounter (HOSPITAL_COMMUNITY): Payer: Self-pay

## 2023-04-23 DIAGNOSIS — K429 Umbilical hernia without obstruction or gangrene: Secondary | ICD-10-CM | POA: Insufficient documentation

## 2023-04-23 DIAGNOSIS — K2970 Gastritis, unspecified, without bleeding: Secondary | ICD-10-CM | POA: Insufficient documentation

## 2023-04-23 HISTORY — DX: Unspecified asthma, uncomplicated: J45.909

## 2023-04-23 HISTORY — DX: Essential (primary) hypertension: I10

## 2023-04-23 LAB — BASIC METABOLIC PANEL WITH GFR
Anion gap: 11 (ref 5–15)
BUN: 15 mg/dL (ref 6–20)
CO2: 22 mmol/L (ref 22–32)
Calcium: 8.6 mg/dL — ABNORMAL LOW (ref 8.9–10.3)
Chloride: 107 mmol/L (ref 98–111)
Creatinine, Ser: 0.77 mg/dL (ref 0.61–1.24)
GFR, Estimated: >60 mL/min (ref >60)
Glucose, Bld: 105 mg/dL — ABNORMAL HIGH (ref 70–99)
Potassium: 3.7 mmol/L (ref 3.5–5.1)
Sodium: 140 mmol/L (ref 135–145)

## 2023-04-23 LAB — HEPATIC FUNCTION PANEL
ALT: 35 U/L (ref 0–44)
AST: 25 U/L (ref 15–41)
Albumin: 3.8 g/dL (ref 3.5–5.0)
Alkaline Phosphatase: 61 U/L (ref 38–126)
Bilirubin, Direct: <0.1 mg/dL (ref 0.0–0.2)
Total Bilirubin: 0.6 mg/dL (ref 0.0–1.2)
Total Protein: 6.8 g/dL (ref 6.5–8.1)

## 2023-04-23 LAB — CBC
HCT: 40.6 % (ref 39.0–52.0)
Hemoglobin: 13.5 g/dL (ref 13.0–17.0)
MCH: 30.5 pg (ref 26.0–34.0)
MCHC: 33.3 g/dL (ref 30.0–36.0)
MCV: 91.9 fL (ref 80.0–100.0)
Platelets: 182 10*3/uL (ref 150–400)
RBC: 4.42 MIL/uL (ref 4.22–5.81)
RDW: 13.2 % (ref 11.5–15.5)
WBC: 6.9 10*3/uL (ref 4.0–10.5)
nRBC: 0 % (ref 0.0–0.2)

## 2023-04-23 LAB — LIPASE, BLOOD: Lipase: 30 U/L (ref 11–51)

## 2023-04-23 LAB — TROPONIN I (HIGH SENSITIVITY): Troponin I (High Sensitivity): 3 ng/L (ref <18)

## 2023-04-23 MED ORDER — AMLODIPINE BESYLATE 5 MG PO TABS: 5.0000 mg | ORAL_TABLET | Freq: Every day | ORAL | 0 refills | Status: AC

## 2023-04-23 MED ORDER — ALUM & MAG HYDROXIDE-SIMETH 200-200-20 MG/5ML PO SUSP
30.0000 mL | Freq: Once | ORAL | Status: AC
  Administered 2023-04-23: 30 mL via ORAL
  Filled 2023-04-23: qty 30

## 2023-04-23 MED ORDER — FAMOTIDINE 20 MG PO TABS
40.0000 mg | ORAL_TABLET | Freq: Once | ORAL | Status: AC
  Administered 2023-04-23: 40 mg via ORAL
  Filled 2023-04-23: qty 2

## 2023-04-23 MED ORDER — FAMOTIDINE 20 MG PO TABS: 20.0000 mg | ORAL_TABLET | Freq: Two times a day (BID) | ORAL | 0 refills | Status: AC

## 2023-04-23 NOTE — ED Provider Notes (Signed)
 [place] EMERGENCY DEPARTMENT AT [facility] Provider Note   CSN: [id number] Arrival date & time: 04/23/23  1944     History {Add pertinent medical, surgical, social history, OB history to HPI:1} Chief Complaint  Patient presents with   Chest Pain    Robert English is a 48 y.o. male presented to ED with several complaints and concerns.  The patient has a history of hypertension and obesity.  A Spanish translator was used for full history and exam.  The patient reports primarily came in because he had epigastric discomfort with some shortness of breath that began earlier today while he was at rest.  He felt pressure in the middle of his stress.  He feels short of breath with it.  Symptoms improved but have not gone away completely.  He denies prior history of acid reflux.  He denies any known coronary or cardiac history or history of exertional chest pain or angina.  He does report that he has had an umbilical hernia for a long time and feels it was bulging a little more than normal.  He is moving his bowels regularly including today and denies vomiting.  I reviewed the patient's external records including an office visit in July 2024 at which time he was complaining of abdominal pain due to a hernia.  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  He also had a small fat-containing umbilical hernia.  HPI     Home Medications Prior to Admission medications   Medication Sig Start Date End Date Taking? Authorizing Provider  amLODipine (NORVASC) 5 MG tablet Take 1 tablet (5 mg total) by mouth daily. 04/23/23 05/23/23 Yes Leeon Makar, Kermit Balo, MD  famotidine (PEPCID) 20 MG tablet Take 1 tablet (20 mg total) by mouth 2 (two) times daily. 04/23/23 05/23/23 Yes Deloras Reichard, Kermit Balo, MD  acetaminophen (TYLENOL) 500 MG tablet Take 500 mg by mouth every  6 (six) hours as needed for mild pain, moderate pain, fever or headache.    [provider]  naproxen (NAPROSYN) 500 MG tablet Take 1 tablet (500 mg total) by mouth 2 (two) times daily with a meal. Patient not taking: Reported on 02/22/2017 04/01/13   Eber Hong, MD  traMADol (ULTRAM) 50 MG tablet Take 1 tablet (50 mg total) by mouth every 6 (six) hours as needed. Patient not taking: Reported on 02/22/2017 04/01/13   Eber Hong, MD      Allergies    Patient has no known allergies.    Review of Systems   Review of Systems  Physical Exam Updated Vital Signs BP 127/75   Pulse 73   Temp 98.5 F (36.9 C) (Oral)   Resp 15   Ht 5\' 6"  (1.676 m)   Wt 104.3 kg   SpO2 96%   BMI 37.12 kg/m  Physical Exam Constitutional:      General: He is not in acute distress.    Appearance: He is obese.  HENT:     Head: Normocephalic and atraumatic.  Eyes:     Conjunctiva/sclera: Conjunctivae normal.     Pupils: Pupils are equal, round, and reactive to light.  Cardiovascular:     Rate and Rhythm: Normal rate and regular rhythm.  Pulmonary:     Effort: Pulmonary effort is normal. No respiratory distress.  Abdominal:     General: There is no distension.     Tenderness: There  is abdominal tenderness in the epigastric area. There is no guarding or rebound.     Comments: Soft, reducible umbilical hernia  Skin:    General: Skin is warm and dry.  Neurological:     General: No focal deficit present.     Mental Status: He is alert. Mental status is at baseline.  Psychiatric:        Mood and Affect: Mood normal.        Behavior: Behavior normal.     ED Results / Procedures / Treatments   Labs (all labs ordered are listed, but only abnormal results are displayed) Labs Reviewed  BASIC METABOLIC PANEL WITH GFR - Abnormal; Notable for the following components:      Result Value   Glucose, Bld 105 (*)    Calcium 8.6 (*)    All other components within normal limits  CBC  HEPATIC  FUNCTION PANEL  LIPASE, BLOOD  TROPONIN I (HIGH SENSITIVITY)    EKG EKG Interpretation Date/Time:  Monday April 23 2023 20:00:16 EDT Ventricular Rate:  102 PR Interval:  131 QRS Duration:  103 QT Interval:  329 QTC Calculation: 429 R Axis:   29  Text Interpretation: Sinus tachycardia Abnormal R-wave progression, early transition Confirmed by Alvester Chou [phone]) on 04/23/2023 9:08:45 PM  Radiology DG Chest 2 View Result Date: 04/23/2023 CLINICAL DATA:  Epigastric pain. EXAM: CHEST - 2 VIEW COMPARISON:  April 01, 2013 FINDINGS: The heart size and mediastinal contours are within normal limits. Low lung volumes are noted. Subsequent crowding of the bibasilar bronchovascular lung markings is noted. There is no evidence of an acute infiltrate, pleural effusion or pneumothorax. The visualized skeletal structures are unremarkable. IMPRESSION: No active cardiopulmonary disease. Electronically Signed   By: Aram Candela M.D.   On: 04/23/2023 20:21    Procedures Procedures  {Document cardiac monitor, telemetry assessment procedure when appropriate:1}  Medications Ordered in ED Medications  famotidine (PEPCID) tablet 40 mg (40 mg Oral Given 04/23/23 2149)  alum & mag hydroxide-simeth (MAALOX/MYLANTA) 200-200-20 MG/5ML suspension 30 mL (30 mLs Oral Given 04/23/23 2149)    ED Course/ Medical Decision Making/ A&P Clinical Course as of 04/23/23 2327  Mon Apr 23, 2023  2313 The patient was reassessed and reports improvement of his pain with a GI cocktail.  I strongly suspect this pain is likely gastritis.  We did discuss, again with a Engineer, structural, the need for him to reestablish care with a PCP as he currently does not have a doctor's office.  I explained to the may have more chronic ongoing medical conditions such as high blood pressure, high cholesterol, diabetes, which we do not screen for specifically in the ER.  He was on a small blood pressure medicine in the past and can restart  them for 30 days while he looks for PCP again.  I will also start him back on Pepcid for his stomach, and provided him information in Spanish about gastritis and reflux symptoms.  Finally, regarding his hernia, or appears to be soft and reducible.  Do not believe that he is needing emergency surgery for this issue.  He is comfortable with this plan and stable for discharge [MT]    Clinical Course User Index [MT] Carine Nordgren, Kermit Balo, MD   {   Click here for ABCD2, HEART and other calculatorsREFRESH Note before signing :1}  Medical Decision Making Amount and/or Complexity of Data Reviewed Labs: ordered. Radiology: ordered.  Risk OTC drugs. Prescription drug management.   This patient presents to the ED with concern for epigastric discomfort, shortness of breath. This involves an extensive number of treatment options, and is a complaint that carries with it a high risk of complications and morbidity.  The differential diagnosis includes gastritis versus reflux or esophagitis versus pneumonia versus viral URI versus ACS versus intra-abdominal process versus other  Co-morbidities that complicate the patient evaluation: History of untreated hypertension as cardiovascular risk factor  The patient's discomfort is directly reproducible with palpation of the epigastrium which suggest to me that this may be reflux or gastritis related most likely.  External records from outside source obtained and reviewed including PCP office record from 1 year ago  I ordered and personally interpreted labs.  The pertinent results include:  ***  I ordered imaging studies including x-ray of the chest I independently visualized and interpreted imaging which showed no emergent findings I agree with the radiologist interpretation  The patient was maintained on a cardiac monitor.  I personally viewed and interpreted the cardiac monitored which showed an underlying rhythm of: Borderline  sinus tachycardia  Per my interpretation the patient's ECG shows mild sinus tachycardia, no acute ischemic findings  I ordered medication including Pepcid and Maalox for suspected gastritis  I have reviewed the patients home medicines and have made adjustments as needed  Test Considered: Low suspicion for acute PE, ischemic bowel, no indication for CT imaging at this time   After the interventions noted above, I reevaluated the patient and found that they have: improved   Dispostion:  After consideration of the diagnostic results and the patients response to treatment, I feel that the patent would benefit from ***.   {Document critical care time when appropriate:1} {Document review of labs and clinical decision tools ie heart score, Chads2Vasc2 etc:1}  {Document your independent review of radiology images, and any outside records:1} {Document your discussion with family members, caretakers, and with consultants:1} {Document social determinants of health affecting pt's care:1} {Document your decision making why or why not admission, treatments were needed:1} Final Clinical Impression(s) / ED Diagnoses Final diagnoses:  Umbilical hernia without obstruction and without gangrene  Gastritis without bleeding, unspecified chronicity, unspecified gastritis type    Rx / DC Orders ED Discharge Orders          Ordered    amLODipine (NORVASC) 5 MG tablet  Daily        04/23/23 2315    famotidine (PEPCID) 20 MG tablet  2 times daily        04/23/23 2315

## 2023-04-23 NOTE — ED Triage Notes (Addendum)
 Epigastric pain that started this AM. Pt states pain is not getting any bettter, c/o SOB. Denies nausea. Pt does have hypertension, but has been out of his medications for 2 years. Pt also wants umbilical hernia checked out. States it started protruding more than usual 2 months ago. Denies pain at hernia site
